# Patient Record
Sex: Female | Born: 1945 | Race: White | Hispanic: No | Marital: Single | Smoking: Current every day smoker
Health system: Southern US, Community
[De-identification: ages and names within clinical notes are randomized; demographics above are authoritative.]

## PROBLEM LIST (undated history)

## (undated) DIAGNOSIS — J449 Chronic obstructive pulmonary disease, unspecified: Secondary | ICD-10-CM

## (undated) DIAGNOSIS — I2510 Atherosclerotic heart disease of native coronary artery without angina pectoris: Secondary | ICD-10-CM

## (undated) HISTORY — PX: JOINT REPLACEMENT: SHX530

---

## 2017-07-15 ENCOUNTER — Other Ambulatory Visit: Payer: Self-pay

## 2017-07-15 ENCOUNTER — Encounter (HOSPITAL_COMMUNITY): Payer: Self-pay | Admitting: Emergency Medicine

## 2017-07-15 ENCOUNTER — Emergency Department (HOSPITAL_COMMUNITY): Payer: Medicare Other

## 2017-07-15 ENCOUNTER — Emergency Department (HOSPITAL_COMMUNITY)
Admission: EM | Admit: 2017-07-15 | Discharge: 2017-07-15 | Disposition: A | Discharge status: Home / Self Care | Payer: Medicare Other | Attending: Emergency Medicine | Admitting: Emergency Medicine

## 2017-07-15 DIAGNOSIS — Z7982 Long term (current) use of aspirin: Secondary | ICD-10-CM | POA: Diagnosis not present

## 2017-07-15 DIAGNOSIS — Z9660 Presence of unspecified orthopedic joint implant: Secondary | ICD-10-CM | POA: Insufficient documentation

## 2017-07-15 DIAGNOSIS — I2510 Atherosclerotic heart disease of native coronary artery without angina pectoris: Secondary | ICD-10-CM | POA: Diagnosis not present

## 2017-07-15 DIAGNOSIS — J441 Chronic obstructive pulmonary disease with (acute) exacerbation: Secondary | ICD-10-CM | POA: Diagnosis not present

## 2017-07-15 DIAGNOSIS — Z79899 Other long term (current) drug therapy: Secondary | ICD-10-CM | POA: Diagnosis not present

## 2017-07-15 DIAGNOSIS — R0602 Shortness of breath: Secondary | ICD-10-CM | POA: Diagnosis present

## 2017-07-15 DIAGNOSIS — F17210 Nicotine dependence, cigarettes, uncomplicated: Secondary | ICD-10-CM | POA: Diagnosis not present

## 2017-07-15 HISTORY — DX: Chronic obstructive pulmonary disease, unspecified: J44.9

## 2017-07-15 HISTORY — DX: Atherosclerotic heart disease of native coronary artery without angina pectoris: I25.10

## 2017-07-15 LAB — BASIC METABOLIC PANEL
Anion gap: 5 (ref 5–15)
BUN: 10 mg/dL (ref 8–23)
CHLORIDE: 107 mmol/L (ref 98–111)
CO2: 26 mmol/L (ref 22–32)
Calcium: 9.3 mg/dL (ref 8.9–10.3)
Creatinine, Ser: 0.93 mg/dL (ref 0.44–1.00)
GFR calc Af Amer: >60 mL/min (ref >60)
GFR calc non Af Amer: 60 mL/min — ABNORMAL LOW (ref >60)
GLUCOSE: 148 mg/dL — AB (ref 70–99)
POTASSIUM: 4 mmol/L (ref 3.5–5.1)
Sodium: 138 mmol/L (ref 135–145)

## 2017-07-15 LAB — CBC WITH DIFFERENTIAL/PLATELET
Abs Immature Granulocytes: 0.1 10*3/uL (ref 0.0–0.1)
Basophils Absolute: 0 10*3/uL (ref 0.0–0.1)
Basophils Relative: 0 %
EOS PCT: 2 %
Eosinophils Absolute: 0.3 10*3/uL (ref 0.0–0.7)
HEMATOCRIT: 39.1 % (ref 36.0–46.0)
HEMOGLOBIN: 12.4 g/dL (ref 12.0–15.0)
Immature Granulocytes: 1 %
LYMPHS ABS: 0.6 10*3/uL — AB (ref 0.7–4.0)
LYMPHS PCT: 5 %
MCH: 30.2 pg (ref 26.0–34.0)
MCHC: 31.7 g/dL (ref 30.0–36.0)
MCV: 95.4 fL (ref 78.0–100.0)
MONO ABS: 1.2 10*3/uL — AB (ref 0.1–1.0)
MONOS PCT: 9 %
Neutro Abs: 10.7 10*3/uL — ABNORMAL HIGH (ref 1.7–7.7)
Neutrophils Relative %: 83 %
Platelets: 210 10*3/uL (ref 150–400)
RBC: 4.1 MIL/uL (ref 3.87–5.11)
RDW: 14.8 % (ref 11.5–15.5)
WBC: 12.9 10*3/uL — ABNORMAL HIGH (ref 4.0–10.5)

## 2017-07-15 LAB — I-STAT TROPONIN, ED
Troponin i, poc: 0.02 ng/mL (ref 0.00–0.08)
Troponin i, poc: 0.02 ng/mL (ref 0.00–0.08)

## 2017-07-15 MED ORDER — ALBUTEROL SULFATE HFA 108 (90 BASE) MCG/ACT IN AERS
1.0000 | INHALATION_SPRAY | Freq: Once | RESPIRATORY_TRACT | Status: AC
  Administered 2017-07-15: 1 via RESPIRATORY_TRACT
  Filled 2017-07-15: qty 6.7

## 2017-07-15 MED ORDER — IPRATROPIUM-ALBUTEROL 0.5-2.5 (3) MG/3ML IN SOLN
3.0000 mL | Freq: Once | RESPIRATORY_TRACT | Status: AC
  Administered 2017-07-15: 3 mL via RESPIRATORY_TRACT
  Filled 2017-07-15: qty 3

## 2017-07-15 MED ORDER — IOPAMIDOL (ISOVUE-370) INJECTION 76%
100.0000 mL | Freq: Once | INTRAVENOUS | Status: AC | PRN
  Administered 2017-07-15: 100 mL via INTRAVENOUS

## 2017-07-15 MED ORDER — AEROCHAMBER PLUS FLO-VU MEDIUM MISC
1.0000 | Freq: Once | Status: DC
  Filled 2017-07-15 (×4): qty 1

## 2017-07-15 MED ORDER — IOPAMIDOL (ISOVUE-370) INJECTION 76%
INTRAVENOUS | Status: AC
  Filled 2017-07-15: qty 100

## 2017-07-15 MED ORDER — PREDNISONE 10 MG PO TABS: 40.0000 mg | ORAL_TABLET | Freq: Every day | ORAL | 0 refills | Status: AC

## 2017-07-15 MED ORDER — PREDNISONE 20 MG PO TABS
40.0000 mg | ORAL_TABLET | Freq: Once | ORAL | Status: AC
  Administered 2017-07-15: 40 mg via ORAL
  Filled 2017-07-15: qty 2

## 2017-07-15 NOTE — ED Notes (Signed)
ED Provider at bedside. 

## 2017-07-15 NOTE — ED Provider Notes (Signed)
[facility] EMERGENCY DEPARTMENT Provider Note   CSN: [id number] Arrival date & time: 07/15/17  9562     History   Chief Complaint Chief Complaint  Patient presents with  . Shortness of Breath  . Facial Pain    HPI Sharon Marshall is a 72 y.o. female with a past medical history of COPD, CAD, who presents to ED for evaluation of shortness of breath, wheezing that began several hours ago upon waking up.  States that she felt fine last night before she fell asleep.  Majority of her history is provided by daughter.  Patient is visiting daughter from [place], [place].  She flew in 4 days ago after traveling for about 24 hours.  Daughter states that patient has had a chronic cough for several years secondary to her tobacco use.  Patient states that she has been coughing up yellow mucus.  She noticed that her mother was wheezing and short of breath this morning.  Worse with ambulation.  She checked her pulse oximetry using another guest's pulse ox meter and showed 88% on room air.  Patient does not wear home oxygen.  She received a breathing treatment using another gas nebulizer machine with significant improvement in her wheezing and shortness of breath.  However, daughter was concerned about her low pulse oximetry and brought her to the ED.  She reports sinus pain for the past several days.  Daughter states that "I think she is just really tired from all the traveling."  Patient resides at a nursing home in [place].  She is established with a cardiologist and primary care provider there.  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  Denies any chest pain, hemoptysis, leg swelling, history of DVT or PE, hemoptysis, hormone use, abdominal pain, vomiting, changes in bowel movements, fever, sick contacts with similar symptoms, changes in appetite or mental status.  HPI  Past Medical History:  Diagnosis Date  . COPD (chronic obstructive  pulmonary disease) (HCC)   . Coronary artery disease     There are no active problems to display for this patient.   Past Surgical History:  Procedure Laterality Date  . JOINT REPLACEMENT       OB History   None      Home Medications    Prior to Admission medications   Medication Sig Start Date End Date Taking? Authorizing Provider  acetaminophen (TYLENOL) 500 MG tablet Take 1,000 mg by mouth as needed for mild pain or headache.   Yes [provider]  aspirin 81 MG chewable tablet Chew 81 mg by mouth daily. 12/07/16  Yes [provider]  atorvastatin (LIPITOR) 40 MG tablet Take 40 mg by mouth daily. 04/08/16  Yes [provider]  buPROPion (WELLBUTRIN SR) 150 MG 12 hr tablet Take 150 mg by mouth 2 (two) times daily. 10/08/15  Yes [provider]  carvedilol (COREG) 3.125 MG tablet Take 3.125 mg by mouth 2 (two) times daily. 06/17/17  Yes [provider]  Cholecalciferol (VITAMIN D-3) 1000 units CAPS Take 1,000 Units by mouth daily. 10/08/15  Yes [provider]  diclofenac sodium (VOLTAREN) 1 % GEL Apply 4 g topically 3 (three) times daily as needed for pain. 06/22/17  Yes [provider]  fluticasone (FLONASE) 50 MCG/ACT nasal spray Place 2 sprays into the nose daily as needed for allergies. 10/17/14  Yes [provider]  hydrOXYzine (ATARAX/VISTARIL) 25 MG tablet Take 12.5  mg by mouth daily. 04/08/16  Yes [provider]  Memantine HCl-Donepezil HCl (NAMZARIC) 28-10 MG CP24 Take 1 capsule by mouth every morning. 10/02/15  Yes [provider]  pantoprazole (PROTONIX) 40 MG tablet Take 40 mg by mouth daily. 09/13/15  Yes [provider]  SPIRIVA HANDIHALER 18 MCG inhalation capsule Place 1 capsule into inhaler and inhale daily. 07/09/17  Yes [provider]  traMADol (ULTRAM) 50 MG tablet Take 50 mg by mouth 2 (two) times daily as needed for pain. 06/23/17  Yes [provider]    vitamin B-12 (CYANOCOBALAMIN) 500 MCG tablet Take 500 mcg by mouth once a week. 10/02/15  Yes [provider]  predniSONE (DELTASONE) 10 MG tablet Take 4 tablets (40 mg total) by mouth daily for 4 days. 07/15/17 07/19/17  Dietrich Pates, PA-C    Family History No family history on file.  Social History Social History   Tobacco Use  . Smoking status: Current Every Day Smoker  . Smokeless tobacco: Current User  Substance Use Topics  . Alcohol use: Yes  . Drug use: Not Currently     Allergies   Penicillins and Levaquin [levofloxacin]   Review of Systems Review of Systems  Constitutional: Negative for appetite change, chills and fever.  HENT: Negative for ear pain, rhinorrhea, sneezing and sore throat.   Eyes: Negative for photophobia and visual disturbance.  Respiratory: Positive for shortness of breath and wheezing. Negative for cough and chest tightness.   Cardiovascular: Negative for chest pain and palpitations.  Gastrointestinal: Negative for abdominal pain, blood in stool, constipation, diarrhea, nausea and vomiting.  Genitourinary: Negative for dysuria, hematuria and urgency.  Musculoskeletal: Negative for myalgias.  Skin: Negative for rash.  Neurological: Negative for dizziness, weakness and light-headedness.     Physical Exam Updated Vital Signs BP (!) 122/57   Pulse 70   Temp 99.4 F (37.4 C) (Oral)   Resp (!) 23   Ht 5' 7.5" (1.715 m)   Wt 71.7 kg (158 lb)   SpO2 96%   BMI 24.38 kg/m   Physical Exam  Constitutional: She appears well-developed and well-nourished. No distress.  HENT:  Head: Normocephalic and atraumatic.  Nose: Right sinus exhibits frontal sinus tenderness. Left sinus exhibits frontal sinus tenderness.    Eyes: Conjunctivae and EOM are normal. Left eye exhibits no discharge. No scleral icterus.  Neck: Normal range of motion. Neck supple.  Cardiovascular: Normal rate, regular rhythm, normal heart sounds and intact distal pulses. Exam  reveals no gallop and no friction rub.  No murmur heard. Pulmonary/Chest: Effort normal. No respiratory distress. She has rhonchi in the right middle field, the right lower field, the left middle field and the left lower field.  Abdominal: Soft. Bowel sounds are normal. She exhibits no distension. There is no tenderness. There is no guarding.  Musculoskeletal: Normal range of motion. She exhibits no edema.       Right lower leg: She exhibits no tenderness and no edema.       Left lower leg: She exhibits no tenderness and no edema.  No lower extremity edema, erythema or calf tenderness bilaterally.  Neurological: She is alert. She exhibits normal muscle tone. Coordination normal.  Skin: Skin is warm and dry. No rash noted.  Psychiatric: She has a normal mood and affect.  Nursing note and vitals reviewed.    ED Treatments / Results  Labs (all labs ordered are listed, but only abnormal results are displayed) Labs Reviewed  BASIC METABOLIC PANEL -  Abnormal; Notable for the following components:      Result Value   Glucose, Bld 148 (*)    GFR calc non Af Amer 60 (*)    All other components within normal limits  CBC WITH DIFFERENTIAL/PLATELET - Abnormal; Notable for the following components:   WBC 12.9 (*)    Neutro Abs 10.7 (*)    Lymphs Abs 0.6 (*)    Monocytes Absolute 1.2 (*)    All other components within normal limits  I-STAT TROPONIN, ED  I-STAT TROPONIN, ED    EKG EKG Interpretation  Date/Time:  Thursday July 15 2017 07:45:52 EDT Ventricular Rate:  78 PR Interval:    QRS Duration: 111 QT Interval:  388 QTC Calculation: 442 R Axis:   -66 Text Interpretation:  Sinus rhythm LVH with secondary repolarization abnormality Inferior infarct, old no prior available for comparison Confirmed by Tilden Fossa [phone]) on 07/15/2017 8:11:39 AM   Radiology Dg Chest 2 View  Result Date: 07/15/2017 CLINICAL DATA:  Shortness of breath EXAM: CHEST - 2 VIEW COMPARISON:  None. FINDINGS:  Cardiac shadow is within normal limits. Postsurgical changes are noted. The lungs are hyperinflated consistent with COPD. Some minimal lingular density is noted which may be related to scarring possibility of a more acute process cannot be totally excluded. No bony abnormality is noted. IMPRESSION: Increase lingular density which may be related to atelectasis or scarring. Electronically Signed   By: Alcide Clever M.D.   On: 07/15/2017 08:44   Ct Angio Chest Pe W/cm &/or Wo Cm  Result Date: 07/15/2017 CLINICAL DATA:  Shortness of breath. EXAM: CT ANGIOGRAPHY CHEST WITH CONTRAST TECHNIQUE: Multidetector CT imaging of the chest was performed using the standard protocol during bolus administration of intravenous contrast. Multiplanar CT image reconstructions and MIPs were obtained to evaluate the vascular anatomy. CONTRAST:  ISOVUE-370 IOPAMIDOL (ISOVUE-370) INJECTION 76% COMPARISON:  Chest x-ray earlier today. FINDINGS: Cardiovascular: Heart size upper normal. No substantial pericardial effusion. Status post mitral valve repair. Coronary artery calcification is evident. Atherosclerotic calcification is noted in the wall of the thoracic aorta. No filling defect within the opacified pulmonary arteries to suggest the presence of an acute pulmonary embolus. Mediastinum/Nodes: Scattered mediastinal lymph nodes measure upper limits of normal for size. 11 mm short axis right hilar lymph node evident. The esophagus has normal imaging features. There is no axillary lymphadenopathy. Lungs/Pleura: The central tracheobronchial airways are patent. Subtle mosaic attenuation likely related to air trapping. Bronchial wall thickening noted with scattered areas of small airway impaction. 8 mm multi lobular nodule identified anterior right lower lobe (7:63). Right middle lobe collapse without evidence for central obstructing mass lesion. Atelectasis or scarring noted in the lingula. No pleural effusion. Upper Abdomen: The liver  shows diffusely decreased attenuation suggesting steatosis. Musculoskeletal: No worrisome lytic or sclerotic osseous abnormality. Superior endplate compression deformity noted at L1. Review of the MIP images confirms the above findings. IMPRESSION: 1. No CT evidence for acute pulmonary embolus. 2. 8 mm lobular right lower lobe nodule. Primary neoplasm a concern. Close follow-up required. Non-contrast chest CT at 6-12 months is recommended. If the nodule is stable at time of repeat CT, then future CT at 18-24 months (from today's scan) is considered optional for low-risk patients, but is recommended for high-risk patients. This recommendation follows the consensus statement: Guidelines for Management of Incidental Pulmonary Nodules Detected on CT Images: From the Fleischner Society 2017; Radiology 2017; 284:228-243. 3. Right middle lobe collapse without central obstructing lesion evident. Attention on  follow-up recommended. 4. Bronchial wall thickening with scattered areas of small airway impaction. Subtle mosaic attenuation on today's exam likely related to air trapping. 5.  Aortic Atherosclerois (ICD10-170.0) Electronically Signed   By: Kennith Center M.D.   On: 07/15/2017 09:59    Procedures Procedures (including critical care time)  Medications Ordered in ED Medications  iopamidol (ISOVUE-370) 76 % injection (has no administration in time range)  albuterol (PROVENTIL HFA;VENTOLIN HFA) 108 (90 Base) MCG/ACT inhaler 1 puff (has no administration in time range)  AEROCHAMBER PLUS FLO-VU MEDIUM MISC 1 each (has no administration in time range)  ipratropium-albuterol (DUONEB) 0.5-2.5 (3) MG/3ML nebulizer solution 3 mL (3 mLs Nebulization Given 07/15/17 0841)  iopamidol (ISOVUE-370) 76 % injection 100 mL (100 mLs Intravenous Contrast Given 07/15/17 0931)  predniSONE (DELTASONE) tablet 40 mg (40 mg Oral Given 07/15/17 1046)     Initial Impression / Assessment and Plan / ED Course  I have reviewed the triage  vital signs and the nursing notes.  Pertinent labs & imaging results that were available during my care of the patient were reviewed by me and considered in my medical decision making (see chart for details).  Clinical Course as of Jul 15 1144  Thu Jul 15, 2017  1022 Patient resting comfortably on exam.  States that breathing treatment helped a lot.   [HK]    Clinical Course User Index [HK] Dietrich Pates, PA-C    73 year old female with a past medical history of COPD, CAD presents for evaluation of shortness of breath and wheezing that began several hours after waking up this morning.  She is visiting her daughter from [place], [place].  She flew in 4 days ago after traveling about 24 hours.  Patient states that she has had a chronic cough and felt fine last night.  She woke up this morning with wheezing and shortness of breath with ambulation.  Her daughter checked her pulse oximeter which showed 88% on room air.  Patient does not wear home oxygen.  She gave 1 breathing treatment with improvement in her symptoms.  However, her oxygen saturations remained low at 8788% so her daughter brought her here.  Patient is followed by cardiologist and primary care provider in [place].  Has not noticed any worsening leg swelling, fever, chest pain.  On physical exam patient has wheezing noted on bilateral lower lung fields on initial exam.  No lower extremity edema, erythema or calf tenderness bilaterally.  Initial and delta troponin are both negative.  EKG is is nonischemic.  CBC shows leukocytosis at 12.7 which is likely reactive.  CT of the chest shows no findings of PE but did show right pulmonary nodule and right middle lobe collapse.  Patient given a breathing treatment and prednisone p.o. with significant improvement in her symptoms.  Oxygen saturations have improved to 96% even with ambulation.  Informed to daughter and patient of findings on CTA.  Will discharge home with remainder of prednisone course,  albuterol with spacer and Mucinex.  Advised to follow-up with PCP and to return to ED for any severe worsening symptoms. Patient discussed with and seen by my attending, Dr. Madilyn Hook.  Portions of this note were generated with Scientist, clinical (histocompatibility and immunogenetics). Dictation errors may occur despite best attempts at proofreading.   Final Clinical Impressions(s) / ED Diagnoses   Final diagnoses:  COPD exacerbation [facility])    ED Discharge Orders        Ordered    predniSONE (DELTASONE) 10 MG tablet  Daily  07/15/17 1144       Dietrich PatesKhatri, Hakiem Malizia, PA-C 07/15/17 1149    Tilden Fossaees, Elizabeth, MD 07/23/17 [id number]

## 2017-07-15 NOTE — ED Triage Notes (Signed)
Pt. Stated, I was out last night and I got SOB really bad and I think that its my sinuses but nobody believes me. It happens all the time

## 2017-07-15 NOTE — ED Notes (Signed)
Pt. Stat 96 while ambulating back to from when pt. Ambulated from room stats 92

## 2017-07-15 NOTE — Discharge Instructions (Addendum)
The CT of your chest today shows a 8 mm right lung nodule and collapse of your right middle lung lobe. Please make your primary care provider aware of these results when you follow-up with them. Take prednisone for the next 4 days beginning tomorrow. You can also take Mucinex as needed along with albuterol. Return to ED for worsening symptoms, severe chest pain or shortness of breath, coughing up blood, increased leg swelling, injuries or falls.

## 2019-04-22 IMAGING — CT CT ANGIO CHEST
3 of 7 series · 18 of 36 positions shown · IV contrast (Omni 300)
Comparison: Chest x-ray earlier today.

CLINICAL DATA: Shortness of breath.

EXAM:
CT ANGIOGRAPHY CHEST WITH CONTRAST
TECHNIQUE: Multidetector CT imaging of the chest was performed using the
standard protocol during bolus administration of intravenous
contrast. Multiplanar CT image reconstructions and MIPs were
obtained to evaluate the vascular anatomy.
CONTRAST:  100mL [id number] IOPAMIDOL ([id number]) INJECTION 76%

[Series 6: pe thins · axial · 0.75mm/px · z∈[+1098,+1368]mm · 13 of 316 slices shown]
[im 23/316  lung]
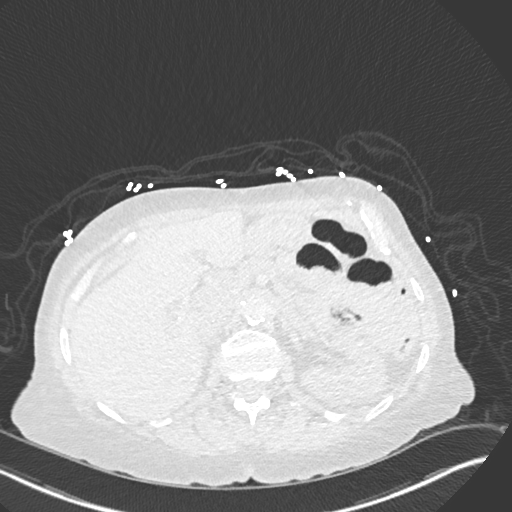
[im 46/316  mediastinal]
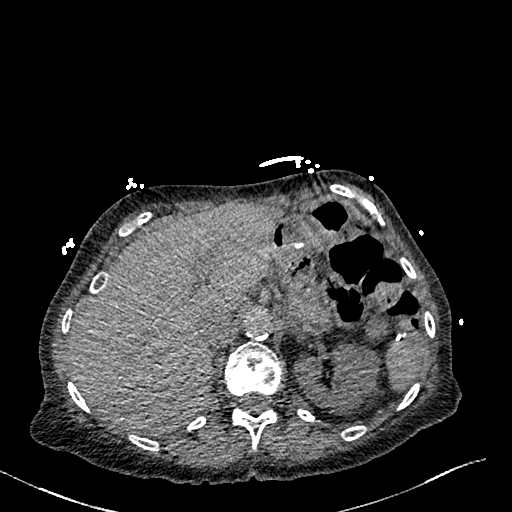
[im 68/316  lung]
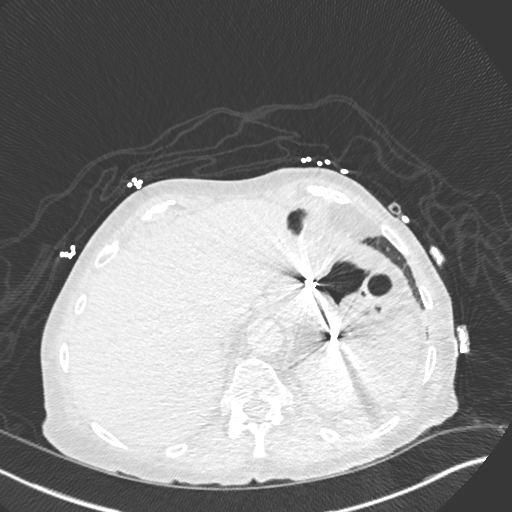
[im 91/316  mediastinal]
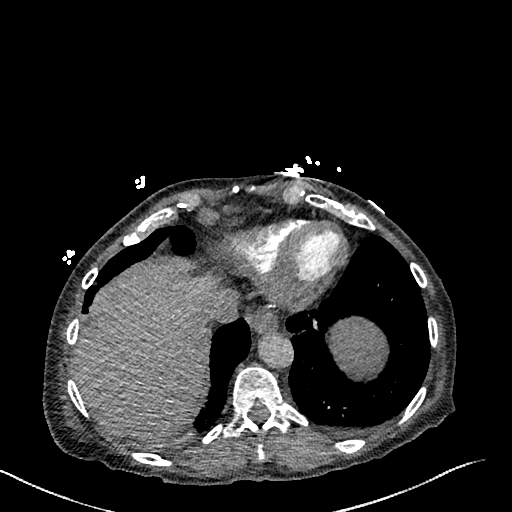
[im 113/316  lung]
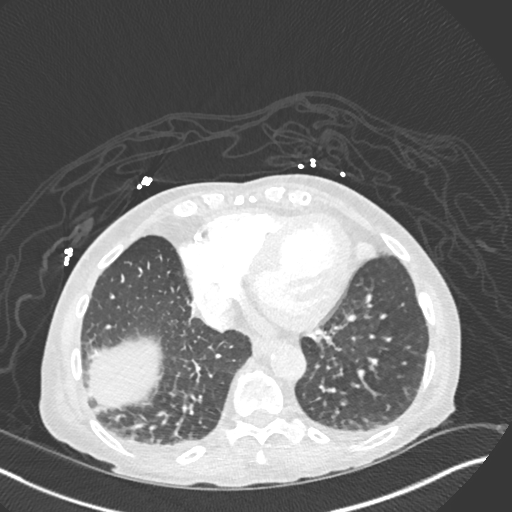
[im 136/316  mediastinal]
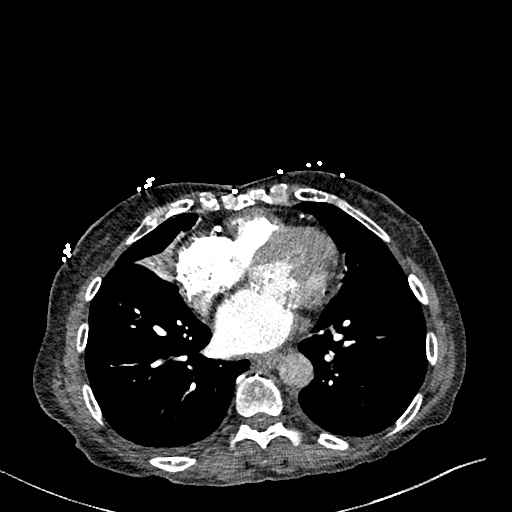
[im 158/316  lung]
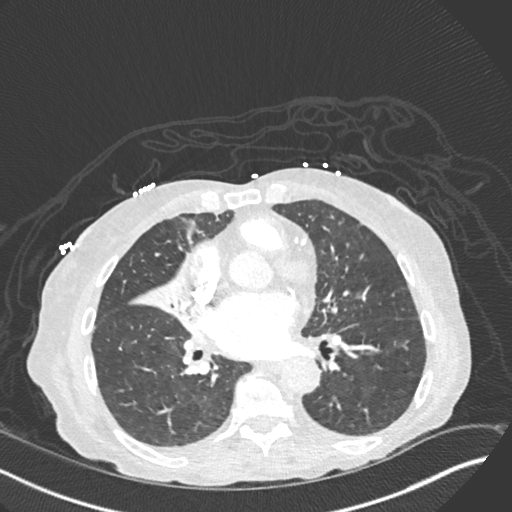
[im 181/316  mediastinal]
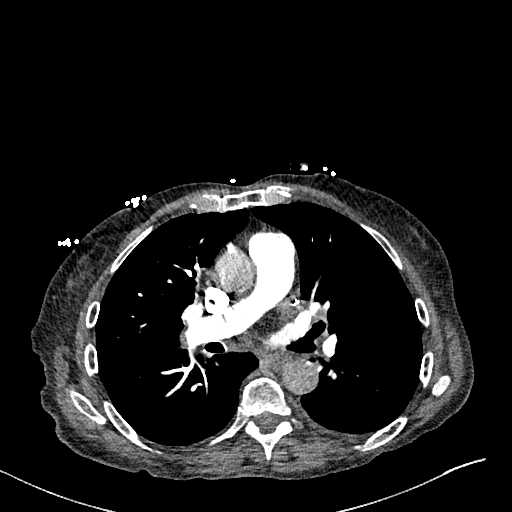
[im 203/316  lung]
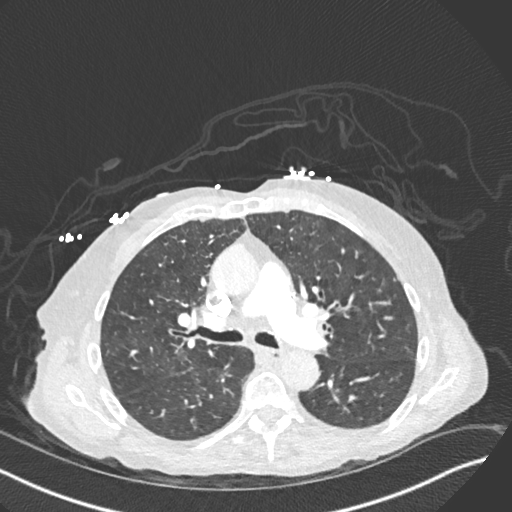
[im 226/316  mediastinal]
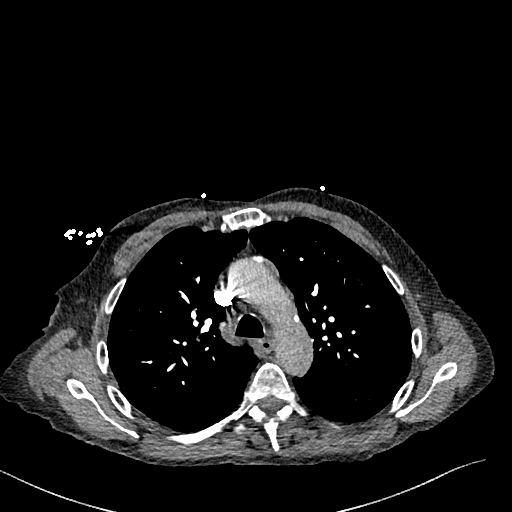
[im 248/316  lung]
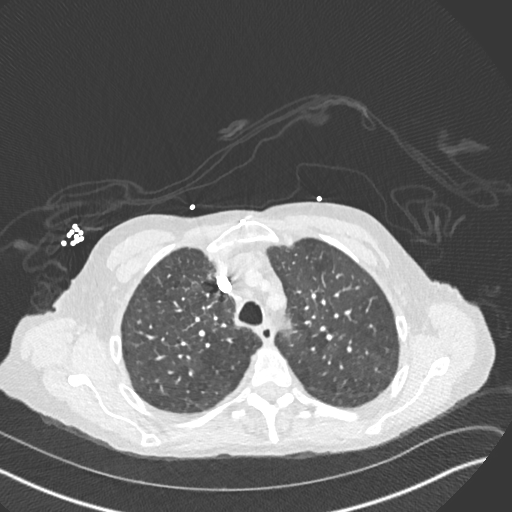
[im 271/316  mediastinal]
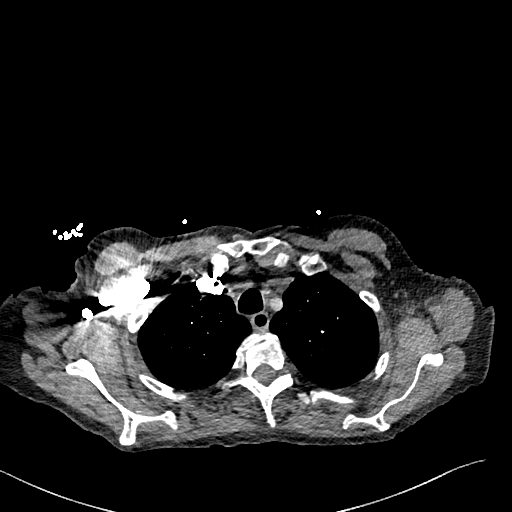
[im 293/316  lung]
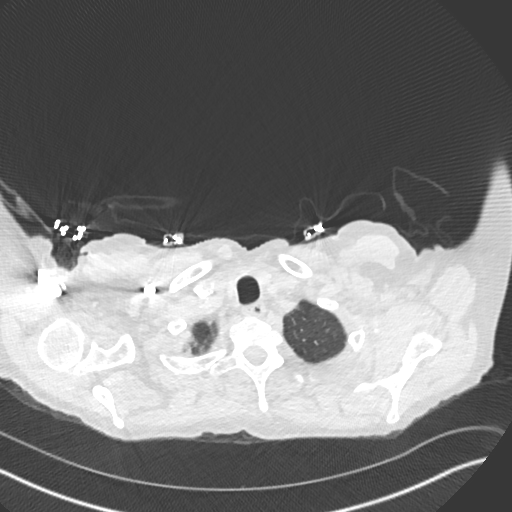

[Series 7: pe lung · axial · 0.75mm/px · z∈[+1182,+1338]mm · 4 of 131 slices shown]
[im 27/131  mediastinal]
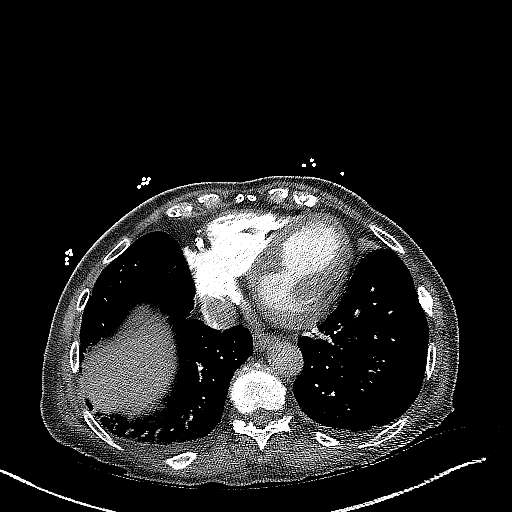
[im 53/131  mediastinal]
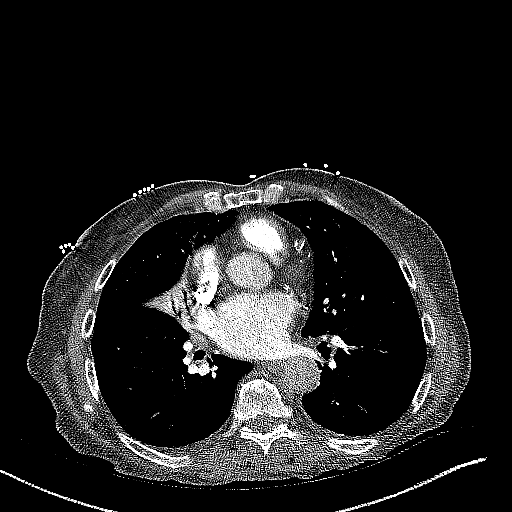
[im 79/131  mediastinal]
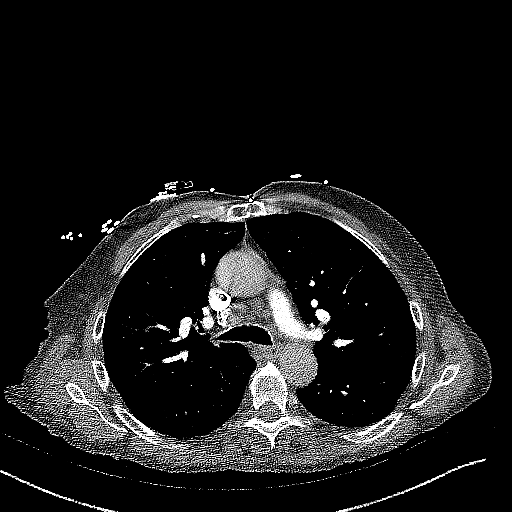
[im 105/131  mediastinal]
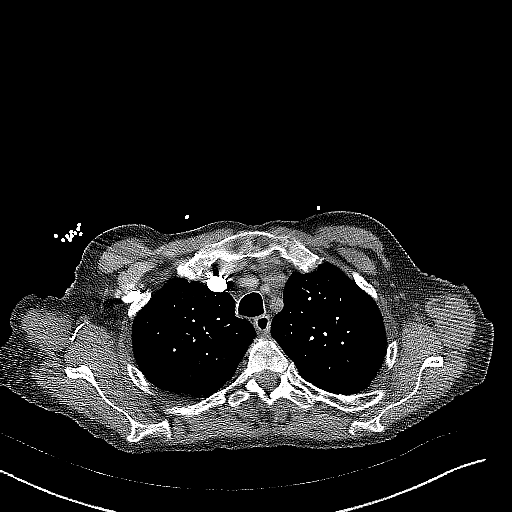

[Series 8: pe 2mm cor · coronal · 0.67mm/px · 1 of 122 slices shown]
[im 61/122  mediastinal]
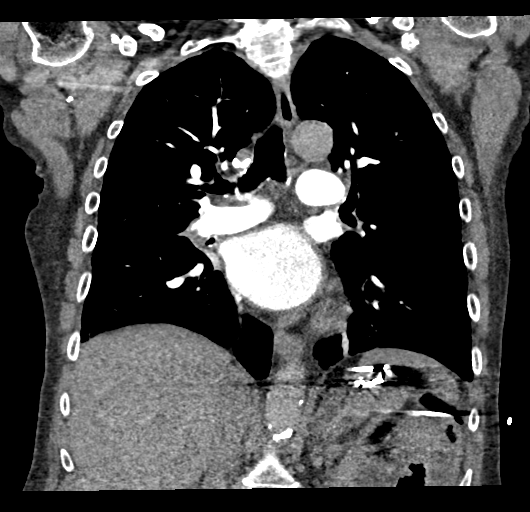

[18 of 36 positions shown; findings below may reference images not displayed]

FINDINGS: Cardiovascular: Heart size upper normal. No substantial pericardial
effusion. Status post mitral valve repair. Coronary artery
calcification is evident. Atherosclerotic calcification is noted in
the wall of the thoracic aorta. No filling defect within the
opacified pulmonary arteries to suggest the presence of an acute
pulmonary embolus.

Mediastinum/Nodes: Scattered mediastinal lymph nodes measure upper
limits of normal for size. 11 mm short axis right hilar lymph node
evident. The esophagus has normal imaging features. There is no
axillary lymphadenopathy.

Lungs/Pleura: The central tracheobronchial airways are patent.
Subtle mosaic attenuation likely related to air trapping. Bronchial
wall thickening noted with scattered areas of small airway
impaction. 8 mm multi lobular nodule identified anterior right lower
lobe ([DATE]). Right middle lobe collapse without evidence for central
obstructing mass lesion. Atelectasis or scarring noted in the
lingula. No pleural effusion.

Upper Abdomen: The liver shows diffusely decreased attenuation
suggesting steatosis.

Musculoskeletal: No worrisome lytic or sclerotic osseous
abnormality. Superior endplate compression deformity noted at L1.

Review of the MIP images confirms the above findings.
IMPRESSION: 1. No CT evidence for acute pulmonary embolus.
2. 8 mm lobular right lower lobe nodule. Primary neoplasm a concern.
Close follow-up required. Non-contrast chest CT at 6-12 months is
recommended. If the nodule is stable at time of repeat CT, then
future CT at 18-24 months (from today's scan) is considered optional
for low-risk patients, but is recommended for high-risk patients.
This recommendation follows the consensus statement: Guidelines for
Management of Incidental Pulmonary Nodules Detected on CT Images:
3. Right middle lobe collapse without central obstructing lesion
evident. Attention on follow-up recommended.
4. Bronchial wall thickening with scattered areas of small airway
impaction. Subtle mosaic attenuation on today's exam likely related
to air trapping.
5.  Aortic Atherosclerois (HV8AZ-170.0)

## 2020-07-12 DEATH — deceased
